# Patient Record
Sex: Male | Born: 1975
Health system: Southern US, Community
[De-identification: ages and names within clinical notes are randomized; demographics above are authoritative.]

---

## 2016-09-27 ENCOUNTER — Emergency Department (HOSPITAL_COMMUNITY)
Admission: EM | Admit: 2016-09-27 | Discharge: 2016-09-27 | Disposition: A | Discharge status: Home / Self Care | Payer: Self-pay | Attending: Emergency Medicine | Admitting: Emergency Medicine

## 2016-09-27 DIAGNOSIS — M25519 Pain in unspecified shoulder: Secondary | ICD-10-CM | POA: Insufficient documentation

## 2016-09-27 DIAGNOSIS — Z5321 Procedure and treatment not carried out due to patient leaving prior to being seen by health care provider: Secondary | ICD-10-CM | POA: Insufficient documentation

## 2016-09-28 ENCOUNTER — Encounter: Payer: Self-pay | Admitting: Physician Assistant

## 2016-09-28 ENCOUNTER — Ambulatory Visit (INDEPENDENT_AMBULATORY_CARE_PROVIDER_SITE_OTHER): Payer: Worker's Compensation | Admitting: Physician Assistant

## 2016-09-28 VITALS — BP 121/73 | HR 58 | Temp 98.2°F | Resp 17 | Ht 72.0 in | Wt 302.0 lb

## 2016-09-28 DIAGNOSIS — M62830 Muscle spasm of back: Secondary | ICD-10-CM | POA: Diagnosis not present

## 2016-09-28 DIAGNOSIS — T1490XA Injury, unspecified, initial encounter: Secondary | ICD-10-CM

## 2016-09-28 DIAGNOSIS — Z026 Encounter for examination for insurance purposes: Secondary | ICD-10-CM | POA: Diagnosis not present

## 2016-09-28 MED ORDER — CYCLOBENZAPRINE HCL 10 MG PO TABS: 5.0000 mg | ORAL_TABLET | Freq: Three times a day (TID) | ORAL | 0 refills | Status: DC | PRN

## 2016-09-28 MED ORDER — MELOXICAM 15 MG PO TABS: 15.0000 mg | ORAL_TABLET | Freq: Every day | ORAL | 0 refills | Status: DC

## 2016-09-28 NOTE — Progress Notes (Signed)
    09/28/2016 1:19 PM   DOB: July 17, 1975 / MRN: [id number]  SUBJECTIVE:  Derek Gaines is a 41 y.o. male presenting for workers compensation shoulder injury that occurred at work.  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  He has a history of similar pain in the left shoulder. Has been working at his current job for about 3 weeks.   He has no allergies on file.   He  has no past medical history on file.    He  reports that he has never smoked. He has never used smokeless tobacco. He  has no sexual activity history on file. The patient  has no past surgical history on file.  His family history is not on file.  Review of Systems  Musculoskeletal: Positive for back pain, joint pain and myalgias. Negative for falls and neck pain.  Neurological: Negative for dizziness, speech change and focal weakness.    The problem list and medications were reviewed and updated by myself where necessary and exist elsewhere in the encounter.   OBJECTIVE:  BP 121/73   Pulse (!) 58   Temp 98.2 F (36.8 C) (Oral)   Resp 17   Ht 6' (1.829 m)   Wt (!) 302 lb (137 kg)   SpO2 98%   BMI 40.96 kg/m   Physical Exam  Constitutional: He is oriented to person, place, and time. He appears well-developed. He is active and cooperative.  Non-toxic appearance.  Eyes: Pupils are equal, round, and reactive to light. EOM are normal.  Cardiovascular: Normal rate.   Pulmonary/Chest: Effort normal. No tachypnea.  Musculoskeletal: He exhibits tenderness (right middle rhomboid and into the posteior aspect of the serratus anterior.  No winged scapula).       Right shoulder: He exhibits normal range of motion, no tenderness, no bony tenderness, no pain and normal strength.  Neurological: He is alert and oriented to person, place, and time. He has normal strength and normal reflexes. He is not  disoriented. No cranial nerve deficit or sensory deficit. He exhibits normal muscle tone. Coordination and gait normal.  Skin: Skin is warm and dry. He is not diaphoretic. No pallor.  Psychiatric: His behavior is normal.  Vitals reviewed.   No results found for this or any previous visit (from the past 72 hour(s)).  No results found.  ASSESSMENT AND PLAN:  [name] was seen today for shoulder pain.  Diagnoses and all orders for this visit:  Soft tissue injury: Possibly overuse, however his lifting load, per hpi, at work is rather light. Will treat symptomatically and try to get him back to work on Monday.  -     meloxicam (MOBIC) 15 MG tablet; Take 1 tablet (15 mg total) by mouth daily.  Spasm of thoracic back muscle -     cyclobenzaprine (FLEXERIL) 10 MG tablet; Take 0.5-1 tablets (5-10 mg total) by mouth 3 (three) times daily as needed for muscle spasms. Do not mix with narcotics.  Encounter related to worker's compensation claim    The patient is advised to call or return to clinic if he does not see an improvement in symptoms, or to seek the care of the closest emergency department if he worsens with the above plan.   Deliah Boston, MHS, PA-C Primary Care at [facility] 09/28/2016 1:19 PM

## 2016-09-28 NOTE — Patient Instructions (Addendum)
  I think you will be well enough to go back to work on Monday with some rest and medication.    IF you received an x-ray today, you will receive an invoice from [facility]. Please contact [facility] at [phone] with questions or concerns regarding your invoice.   IF you received labwork today, you will receive an invoice from [facility]. Please contact [facility] at [phone] with questions or concerns regarding your invoice.   Our billing staff will not be able to assist you with questions regarding bills from these companies.  You will be contacted with the lab results as soon as they are available. The fastest way to get your results is to activate your My Chart account. Instructions are located on the last page of this paperwork. If you have not heard from [place] regarding the results in 2 weeks, please contact this office.

## 2016-09-28 NOTE — Addendum Note (Signed)
Addended by: Ofilia Neas on: 09/28/2016 01:21 PM   Modules accepted: Level of Service

## 2017-01-16 ENCOUNTER — Ambulatory Visit (INDEPENDENT_AMBULATORY_CARE_PROVIDER_SITE_OTHER): Payer: BLUE CROSS/BLUE SHIELD | Admitting: Physician Assistant

## 2017-01-16 ENCOUNTER — Other Ambulatory Visit: Payer: Self-pay

## 2017-01-16 ENCOUNTER — Encounter: Payer: Self-pay | Admitting: Physician Assistant

## 2017-01-16 VITALS — BP 112/70 | HR 72 | Temp 98.3°F | Resp 16 | Ht 70.91 in | Wt 312.6 lb

## 2017-01-16 DIAGNOSIS — Z130 Encounter for screening for diseases of the blood and blood-forming organs and certain disorders involving the immune mechanism: Secondary | ICD-10-CM

## 2017-01-16 DIAGNOSIS — R945 Abnormal results of liver function studies: Secondary | ICD-10-CM | POA: Diagnosis not present

## 2017-01-16 DIAGNOSIS — Z13228 Encounter for screening for other metabolic disorders: Secondary | ICD-10-CM | POA: Diagnosis not present

## 2017-01-16 DIAGNOSIS — Z1321 Encounter for screening for nutritional disorder: Secondary | ICD-10-CM

## 2017-01-16 DIAGNOSIS — Z0000 Encounter for general adult medical examination without abnormal findings: Secondary | ICD-10-CM | POA: Diagnosis not present

## 2017-01-16 DIAGNOSIS — R7989 Other specified abnormal findings of blood chemistry: Secondary | ICD-10-CM

## 2017-01-16 DIAGNOSIS — Z1329 Encounter for screening for other suspected endocrine disorder: Secondary | ICD-10-CM | POA: Diagnosis not present

## 2017-01-16 NOTE — Patient Instructions (Signed)
     IF you received an x-ray today, you will receive an invoice from [facility]. Please contact [facility] at [phone] with questions or concerns regarding your invoice.   IF you received labwork today, you will receive an invoice from [facility]. Please contact [facility] at [phone] with questions or concerns regarding your invoice.   Our billing staff will not be able to assist you with questions regarding bills from these companies.  You will be contacted with the lab results as soon as they are available. The fastest way to get your results is to activate your My Chart account. Instructions are located on the last page of this paperwork. If you have not heard from us regarding the results in 2 weeks, please contact this office.     

## 2017-01-16 NOTE — Progress Notes (Signed)
01/19/2017 8:13 AM   DOB: 1975/12/12 / MRN: [id number]  SUBJECTIVE:  Derek Gaines is a 42 y.o. male presenting for annual exam. From [place].  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  Lost his wife and 92 month old son suddenly about two years ago.  Is still dealing with this but tries to keep a positive outlook.  No known family history of colon or prostate cancer.   Depression screen PHQ 2/9 01/16/2017  Decreased Interest 1  Down, Depressed, Hopeless 1  PHQ - 2 Score 2  Altered sleeping 1  Tired, decreased energy 1  Change in appetite 1  Feeling bad or failure about yourself  1  Trouble concentrating 0  Moving slowly or fidgety/restless 0  Suicidal thoughts 0  PHQ-9 Score 6  Difficult doing work/chores Somewhat difficult     He has No Known Allergies.   He  has no past medical history on file.    He  reports that  has never smoked. he has never used smokeless tobacco. He  has no sexual activity history on file. The patient  has no past surgical history on file.  His family history is not on file.  Review of Systems  Constitutional: Negative for chills, diaphoresis and fever.  Eyes: Negative.   Respiratory: Negative for cough, hemoptysis, sputum production, shortness of breath and wheezing.   Cardiovascular: Negative for chest pain, orthopnea and leg swelling.  Gastrointestinal: Negative for abdominal pain, blood in stool, constipation, diarrhea, heartburn, melena, nausea and vomiting.  Genitourinary: Negative for flank pain.  Skin: Negative for rash.  Neurological: Negative for dizziness, sensory change, speech change, focal weakness and headaches.    The problem list and medications were reviewed and updated by myself where necessary and exist elsewhere in the encounter.   OBJECTIVE:  BP 112/70 (BP Location: Right Arm, Patient Position: Sitting, Cuff Size: Large)   Pulse 72   Temp 98.3 F (36.8 C) (Oral)   Resp 16   Ht 5' 10.91" (1.801 m)   Wt (!) 312 lb 9.6  oz (141.8 kg)   SpO2 97%   BMI 43.71 kg/m   Physical Exam  Constitutional: He appears well-developed. He is active and cooperative.  Non-toxic appearance.  Cardiovascular: Normal rate, regular rhythm, S1 normal, S2 normal, normal heart sounds, intact distal pulses and normal pulses. Exam reveals no gallop and no friction rub.  No murmur heard. Pulmonary/Chest: Effort normal. No stridor. No tachypnea. No respiratory distress. He has no wheezes. He has no rales.  Abdominal: He exhibits no distension.  Musculoskeletal: He exhibits no edema.  Neurological: He is alert.  Skin: Skin is warm and dry. He is not diaphoretic. No pallor.  Vitals reviewed.   Results for orders placed or performed in visit on 01/16/17 (from the past 72 hour(s))  CBC     Status: Abnormal   Collection Time: 01/16/17  4:57 PM  Result Value Ref Range   WBC 6.7 3.4 - 10.8 x10E3/uL   RBC 5.67 4.14 - 5.80 x10E6/uL   Hemoglobin 13.4 13.0 - 17.7 g/dL   Hematocrit 95.6 21.3 - 51.0 %   MCV 73 (L) 79 - 97 fL   MCH 23.6 (L) 26.6 - 33.0 pg   MCHC 32.4 31.5 - 35.7 g/dL   RDW 08.6 57.8 - 46.9 %   Platelets 328 150 - 379 x10E3/uL  Lipid panel     Status: Abnormal   Collection Time: 01/16/17  4:57 PM  Result Value Ref  Range   Cholesterol, Total 167 100 - 199 mg/dL   Triglycerides 85 0 - 149 mg/dL   HDL 37 (L) >16 mg/dL   VLDL Cholesterol Cal 17 5 - 40 mg/dL   LDL Calculated 109 (H) 0 - 99 mg/dL   Chol/HDL Ratio 4.5 0.0 - 5.0 ratio    Comment:                                   T. Chol/HDL Ratio                                             Men  Women                               1/2 Avg.Risk  3.4    3.3                                   Avg.Risk  5.0    4.4                                2X Avg.Risk  9.6    7.1                                3X Avg.Risk 23.4   11.0   TSH     Status: None   Collection Time: 01/16/17  4:57 PM  Result Value Ref Range   TSH 0.638 0.450 - 4.500 uIU/mL  Hemoglobin A1c     Status: Abnormal    Collection Time: 01/16/17  4:57 PM  Result Value Ref Range   Hgb A1c MFr Bld 6.1 (H) 4.8 - 5.6 %    Comment:          Prediabetes: 5.7 - 6.4          Diabetes: >6.4          Glycemic control for adults with diabetes: <7.0    Est. average glucose Bld gHb Est-mCnc 128 mg/dL  HIV antibody     Status: None   Collection Time: 01/16/17  4:57 PM  Result Value Ref Range   HIV Screen 4th Generation wRfx Non Reactive Non Reactive  Basic metabolic panel     Status: None   Collection Time: 01/16/17  4:57 PM  Result Value Ref Range   Glucose 92 65 - 99 mg/dL   BUN 12 6 - 20 mg/dL   Creatinine, Ser 6.04 0.76 - 1.27 mg/dL   GFR calc non Af Amer 90 >59 mL/min/1.73   GFR calc Af Amer 104 >59 mL/min/1.73   BUN/Creatinine Ratio 12 9 - 20   Sodium 140 134 - 144 mmol/L   Potassium 4.5 3.5 - 5.2 mmol/L   Chloride 101 96 - 106 mmol/L   CO2 23 20 - 29 mmol/L   Calcium 9.6 8.7 - 10.2 mg/dL  Hepatic function panel     Status: Abnormal   Collection Time: 01/16/17  4:57 PM  Result Value Ref Range   Total Protein 7.5 6.0 - 8.5 g/dL   Albumin  4.3 3.5 - 5.5 g/dL   Bilirubin Total 0.3 0.0 - 1.2 mg/dL   Bilirubin, Direct 4.090.11 0.00 - 0.40 mg/dL   Alkaline Phosphatase 85 39 - 117 IU/L   AST 47 (H) 0 - 40 IU/L   ALT 52 (H) 0 - 44 IU/L    No results found.  ASSESSMENT AND PLAN:  Derek Gaines was seen today for annual exam.  Diagnoses and all orders for this visit:  Encounter for annual physical exam: Labs out.  Will see him back next year.  Sooner if any new problems. Has had the seasonal flu given [employer] employee.  Screening for endocrine, nutritional, metabolic and immunity disorder -     CBC -     Lipid panel -     TSH -     Hemoglobin A1c -     HIV antibody -     Basic metabolic panel -     Hepatic function panel    The patient is advised to call or return to clinic if he does not see an improvement in symptoms, or to seek the care of the closest emergency department if he worsens with the above  plan.   Deliah BostonMichael Elanor Cale, MHS, PA-C Primary Care at [facility] 01/19/2017 8:13 AM

## 2017-01-17 LAB — CBC
HEMATOCRIT: 41.3 % (ref 37.5–51.0)
HEMOGLOBIN: 13.4 g/dL (ref 13.0–17.7)
MCH: 23.6 pg — ABNORMAL LOW (ref 26.6–33.0)
MCHC: 32.4 g/dL (ref 31.5–35.7)
MCV: 73 fL — ABNORMAL LOW (ref 79–97)
Platelets: 328 10*3/uL (ref 150–379)
RBC: 5.67 x10E6/uL (ref 4.14–5.80)
RDW: 14.9 % (ref 12.3–15.4)
WBC: 6.7 10*3/uL (ref 3.4–10.8)

## 2017-01-17 LAB — BASIC METABOLIC PANEL
BUN/Creatinine Ratio: 12 (ref 9–20)
BUN: 12 mg/dL (ref 6–20)
CALCIUM: 9.6 mg/dL (ref 8.7–10.2)
CO2: 23 mmol/L (ref 20–29)
CREATININE: 1.04 mg/dL (ref 0.76–1.27)
Chloride: 101 mmol/L (ref 96–106)
GFR calc Af Amer: 104 mL/min/{1.73_m2} (ref >59)
GFR, EST NON AFRICAN AMERICAN: 90 mL/min/{1.73_m2} (ref >59)
GLUCOSE: 92 mg/dL (ref 65–99)
Potassium: 4.5 mmol/L (ref 3.5–5.2)
Sodium: 140 mmol/L (ref 134–144)

## 2017-01-17 LAB — HEMOGLOBIN A1C
Est. average glucose Bld gHb Est-mCnc: 128 mg/dL
HEMOGLOBIN A1C: 6.1 % — AB (ref 4.8–5.6)

## 2017-01-17 LAB — HEPATIC FUNCTION PANEL
ALBUMIN: 4.3 g/dL (ref 3.5–5.5)
ALK PHOS: 85 IU/L (ref 39–117)
ALT: 52 IU/L — ABNORMAL HIGH (ref 0–44)
AST: 47 IU/L — AB (ref 0–40)
BILIRUBIN TOTAL: 0.3 mg/dL (ref 0.0–1.2)
BILIRUBIN, DIRECT: 0.11 mg/dL (ref 0.00–0.40)
TOTAL PROTEIN: 7.5 g/dL (ref 6.0–8.5)

## 2017-01-17 LAB — LIPID PANEL
CHOL/HDL RATIO: 4.5 ratio (ref 0.0–5.0)
CHOLESTEROL TOTAL: 167 mg/dL (ref 100–199)
HDL: 37 mg/dL — AB (ref >39)
LDL CALC: 113 mg/dL — AB (ref 0–99)
TRIGLYCERIDES: 85 mg/dL (ref 0–149)
VLDL CHOLESTEROL CAL: 17 mg/dL (ref 5–40)

## 2017-01-17 LAB — HIV ANTIBODY (ROUTINE TESTING W REFLEX): HIV Screen 4th Generation wRfx: NONREACTIVE

## 2017-01-17 LAB — TSH: TSH: 0.638 u[IU]/mL (ref 0.450–4.500)

## 2017-01-17 NOTE — Progress Notes (Signed)
Please add on acute hepatitis panel and iron. Dx elevated lft

## 2017-01-19 DIAGNOSIS — R945 Abnormal results of liver function studies: Secondary | ICD-10-CM | POA: Diagnosis not present

## 2017-01-19 NOTE — Addendum Note (Signed)
Addended by: Baldwin CrownJOHNSON, Saumya Hukill D on: 01/19/2017 10:21 AM   Modules accepted: Orders

## 2017-01-23 LAB — IRON: Iron: 55 ug/dL (ref 38–169)

## 2017-01-23 LAB — HEPATITIS PANEL, ACUTE
HEP A IGM: NEGATIVE
HEP B C IGM: NEGATIVE
HEP B S AG: NEGATIVE

## 2017-04-17 DIAGNOSIS — M84375A Stress fracture, left foot, initial encounter for fracture: Secondary | ICD-10-CM | POA: Diagnosis not present

## 2017-04-17 DIAGNOSIS — M7672 Peroneal tendinitis, left leg: Secondary | ICD-10-CM | POA: Diagnosis not present

## 2017-04-24 DIAGNOSIS — M84375D Stress fracture, left foot, subsequent encounter for fracture with routine healing: Secondary | ICD-10-CM | POA: Diagnosis not present

## 2017-05-08 DIAGNOSIS — M84375D Stress fracture, left foot, subsequent encounter for fracture with routine healing: Secondary | ICD-10-CM | POA: Diagnosis not present

## 2017-05-21 DIAGNOSIS — M84375D Stress fracture, left foot, subsequent encounter for fracture with routine healing: Secondary | ICD-10-CM | POA: Diagnosis not present

## 2017-08-02 DIAGNOSIS — M84375A Stress fracture, left foot, initial encounter for fracture: Secondary | ICD-10-CM | POA: Diagnosis not present

## 2017-11-14 ENCOUNTER — Ambulatory Visit: Payer: BLUE CROSS/BLUE SHIELD | Admitting: Physician Assistant

## 2017-11-14 ENCOUNTER — Encounter: Payer: Self-pay | Admitting: Physician Assistant

## 2017-11-14 VITALS — BP 106/69 | HR 81 | Temp 98.1°F | Ht 71.0 in | Wt 305.0 lb

## 2017-11-14 DIAGNOSIS — Z8669 Personal history of other diseases of the nervous system and sense organs: Secondary | ICD-10-CM | POA: Diagnosis not present

## 2017-11-14 DIAGNOSIS — G8929 Other chronic pain: Secondary | ICD-10-CM | POA: Diagnosis not present

## 2017-11-14 DIAGNOSIS — M546 Pain in thoracic spine: Secondary | ICD-10-CM

## 2017-11-14 DIAGNOSIS — Z87312 Personal history of (healed) stress fracture: Secondary | ICD-10-CM

## 2017-11-14 MED ORDER — MELOXICAM 7.5 MG PO TABS: 7.5000 mg | ORAL_TABLET | Freq: Every day | ORAL | 1 refills | Status: DC

## 2017-11-14 MED ORDER — CYCLOBENZAPRINE HCL 10 MG PO TABS: 5.0000 mg | ORAL_TABLET | Freq: Three times a day (TID) | ORAL | 0 refills | Status: DC | PRN

## 2017-11-14 NOTE — Patient Instructions (Addendum)
Come back in Jan for your annual exam and blood work.   Meloxiam is an NSAID. Do not use with any other otc pain medication other than tylenol/acetaminophen - so no aleve, ibuprofen, motrin, advil, etc.  Flexeril is a muscle relaxer. Try taking 61m instead of 139mand see if this makes you less drowsy  You can take these medications together.   Apply moist heat to the area. Wet a towel and wring it out so it is damp. Put it in the microwave for about 15-20 seconds - long enough to make it hot, but not too hot to apply to your skin causing burns. Do this for about 20-30 minutes, 3-4 times a day.   Perform gentle, light stretches 2-3 times a day.   Put a tennis ball between your back and a wall. Gentle massage the area with rolling the ball around the affected area. Try a foam roller or .   Stay well hydrated - try to drink 32-64 oz/day.   You will receive a phone call to schedule an appointment with sports medicine for your foot, physical therapy and sleep studies.    Thank you for coming in today. I hope you feel we met your needs.  Feel free to call PCP if you have any questions or further requests.  Please consider signing up for MyChart if you do not already have it, as this is a great way to communicate with me.  Best,  WhITT IndustriesPA-C

## 2017-11-14 NOTE — Progress Notes (Signed)
   Derek Gaines  MRN: [id number] DOB: 12/10/1975  PCP: Patient, No Pcp Per  Subjective:  Pt is a 42 year old male who presents to clinic for several complaints.   Back pain - mid back. Has been a problem for him for > 10 years. 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 Problem comes and goes. Happens if he overdoes it at work. Feels "tight"when pain is present he cannot sit up straight.  Tylenol or warm compress. He stretches, massage roller and gets massages sometimes.  Has taken flexeril 10mg  is the past - helps the tightness, but it makes him very drowsy.  No pain currently.   H/o stress fracture of left foot in Feb 2019. con't to experience pain especially with steps. Pain is sometimes "shooting." pain is every day. Is intermittent.   OSA on CPAP x 5 years. Is not using CPAP. Would like another study.   Social history: Wife passed about 5 years ago from complications due to eclampsia while 8 months pregnant. She lost the child as well.  He has a 55 year old son.   Review of Systems  Musculoskeletal: Positive for arthralgias, back pain and gait problem.  Neurological: Negative for dizziness, light-headedness and headaches.    There are no active problems to display for this patient.   No current outpatient medications on file prior to visit.   No current facility-administered medications on file prior to visit.    No Known Allergies   Objective:  Blood pressure 106/69, pulse 81, temperature 98.1 F (36.7 C), temperature source Oral, height 5\' 11"  (1.803 m), weight (!) 305 lb (138.3 kg), SpO2 98 %.  Physical Exam  Constitutional: He appears well-developed and well-nourished. No distress.  Musculoskeletal:       Thoracic back: He exhibits normal range of motion, no tenderness and no bony tenderness.       Lumbar back: He exhibits normal range of motion, no tenderness and no bony tenderness.  Psychiatric: He has a normal mood and affect. Thought  content normal.  Vitals reviewed.   Assessment and Plan :  1. Chronic bilateral thoracic back pain - Intermittent chronic lower and thoracic back pain. Plan to refer to PT for treatment and home care tips. Will try flexeril 5mg  and see if he is less drowsy. Advised pt start exercising on a regular basis.  - Ambulatory referral to Physical Therapy - cyclobenzaprine (FLEXERIL) 10 MG tablet; Take 0.5 tablets (5 mg total) by mouth 3 (three) times daily as needed for muscle spasms.  Dispense: 30 tablet; Refill: 0 - meloxicam (MOBIC) 7.5 MG tablet; Take 1-2 tablets (7.5-15 mg total) by mouth daily.  Dispense: 30 tablet; Refill: 1  2. History of stress fracture - Ambulatory referral to Sports Medicine  3. History of sleep apnea - Ambulatory referral to Sleep Studies  Marco Collie, PA-C  Primary Care at [facility] 11/14/2017 5:02 PM  Please note: Portions of this report may have been transcribed using dragon voice recognition software. Every effort was made to ensure accuracy; however, inadvertent computerized transcription errors may be present.

## 2017-12-11 DIAGNOSIS — M79672 Pain in left foot: Secondary | ICD-10-CM | POA: Diagnosis not present

## 2017-12-11 DIAGNOSIS — M25552 Pain in left hip: Secondary | ICD-10-CM | POA: Diagnosis not present

## 2017-12-11 DIAGNOSIS — M545 Low back pain: Secondary | ICD-10-CM | POA: Diagnosis not present

## 2017-12-11 DIAGNOSIS — M25562 Pain in left knee: Secondary | ICD-10-CM | POA: Diagnosis not present

## 2018-01-26 ENCOUNTER — Encounter: Payer: Self-pay | Admitting: Physician Assistant

## 2018-09-28 ENCOUNTER — Emergency Department (HOSPITAL_COMMUNITY)
Admission: EM | Admit: 2018-09-28 | Discharge: 2018-09-28 | Disposition: A | Discharge status: Home / Self Care | Payer: Self-pay | Attending: Emergency Medicine | Admitting: Emergency Medicine

## 2018-09-28 ENCOUNTER — Encounter (HOSPITAL_COMMUNITY): Payer: Self-pay

## 2018-09-28 ENCOUNTER — Other Ambulatory Visit: Payer: Self-pay

## 2018-09-28 ENCOUNTER — Emergency Department (HOSPITAL_BASED_OUTPATIENT_CLINIC_OR_DEPARTMENT_OTHER): Payer: Self-pay

## 2018-09-28 ENCOUNTER — Emergency Department (HOSPITAL_COMMUNITY): Payer: Self-pay

## 2018-09-28 DIAGNOSIS — R2242 Localized swelling, mass and lump, left lower limb: Secondary | ICD-10-CM | POA: Insufficient documentation

## 2018-09-28 DIAGNOSIS — M25562 Pain in left knee: Secondary | ICD-10-CM

## 2018-09-28 DIAGNOSIS — G8929 Other chronic pain: Secondary | ICD-10-CM

## 2018-09-28 MED ORDER — CYCLOBENZAPRINE HCL 5 MG PO TABS: 5.0000 mg | ORAL_TABLET | Freq: Three times a day (TID) | ORAL | 0 refills | Status: DC | PRN

## 2018-09-28 MED ORDER — IBUPROFEN 800 MG PO TABS: 800.0000 mg | ORAL_TABLET | Freq: Three times a day (TID) | ORAL | 0 refills | Status: DC | PRN

## 2018-09-28 MED ORDER — IBUPROFEN 800 MG PO TABS
800.0000 mg | ORAL_TABLET | Freq: Once | ORAL | Status: AC
  Administered 2018-09-28: 15:00:00 800 mg via ORAL
  Filled 2018-09-28: qty 1

## 2018-09-28 NOTE — ED Provider Notes (Signed)
[facility] DEPT Provider Note   CSN: [id number] Arrival date & time: 09/28/18  1343     History   Chief Complaint Chief Complaint  Patient presents with  . Knee Pain    HPI Derek Gaines is a 43 y.o. male.     The history is provided by the patient and medical records. No language interpreter was used.  Knee Pain  Derek Gaines is an otherwise healthy 43 y.o. male who presents to the Emergency Department complaining of left knee pain for the last 3 days.  Mostly to the posterior aspect of the knee.  Radiates down into his calf and his calf does feel tight.  Worse with certain movements especially stretching the leg out into full extension as well as with walking.  Denies numbness or weakness.  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  Denies any history of similar.  No recent travel/surgeries/immobilizations.  No history of coagulopathy or previous DVT/PE.  No chest pain or shortness of breath.  No wounds.   History reviewed. No pertinent past medical history.  There are no active problems to display for this patient.   History reviewed. No pertinent surgical history.      Home Medications    Prior to Admission medications   Medication Sig Start Date End Date Taking? Authorizing Provider  cyclobenzaprine (FLEXERIL) 10 MG tablet Take 0.5 tablets (5 mg total) by mouth 3 (three) times daily as needed for muscle spasms. 11/14/17   McVey, Gelene Mink, PA-C  meloxicam (MOBIC) 7.5 MG tablet Take 1-2 tablets (7.5-15 mg total) by mouth daily. 11/14/17   McVey, Gelene Mink, PA-C    Family History No family history on file.  Social History Social History   Tobacco Use  . Smoking status: Never Smoker  . Smokeless tobacco: Never Used  Substance Use Topics  . Alcohol use: Yes  . Drug use: Never     Allergies   Patient has no known allergies.   Review  of Systems Review of Systems  Cardiovascular: Positive for leg swelling. Negative for chest pain and palpitations.  Musculoskeletal: Positive for arthralgias and myalgias.  All other systems reviewed and are negative.    Physical Exam Updated Vital Signs BP 131/69 (BP Location: Left Arm)   Pulse 75   Temp 98.6 F (37 C) (Oral)   Resp 18   Ht 5\' 11"  (1.803 m)   Wt 134 kg   SpO2 100%   BMI 41.20 kg/m   Physical Exam Vitals signs and nursing note reviewed.  Constitutional:      General: He is not in acute distress.    Appearance: He is well-developed.  HENT:     Head: Normocephalic and atraumatic.  Neck:     Musculoskeletal: Neck supple.  Cardiovascular:     Rate and Rhythm: Normal rate and regular rhythm.     Heart sounds: Normal heart sounds. No murmur.  Pulmonary:     Effort: Pulmonary effort is normal. No respiratory distress.     Breath sounds: Normal breath sounds. No wheezing or rales.  Musculoskeletal:     Comments: Left lower extremity: Tenderness to the medial joint line as well as posterior fossa of the knee.  Does have mild swelling to the calf and knee when compared to the right lower extremity.  Full range of motion.  Ligaments intact.  No abnormal alignment or patellar  mobility. No bruising, erythema or warmth overlaying the joint. Negative  McMurray's.  No crepitus. 2+ DP pulses bilaterally. All compartments are soft. Sensation intact distal to injury.  Skin:    General: Skin is warm and dry.  Neurological:     Mental Status: He is alert.      ED Treatments / Results  Labs (all labs ordered are listed, but only abnormal results are displayed) Labs Reviewed - No data to display  EKG None  Radiology Dg Knee Complete 4 Views Left  Result Date: 09/28/2018 CLINICAL DATA:  LEFT knee pain. EXAM: LEFT KNEE - COMPLETE 4+ VIEW COMPARISON:  None. FINDINGS: No evidence of fracture, dislocation, or joint effusion. No evidence of arthropathy. No acute or  significant bone abnormality. Soft tissues are unremarkable. IMPRESSION: No acute findings. No degenerative change. No appreciable joint effusion. Electronically Signed   By: Bary RichardStan  Maynard M.D.   On: 09/28/2018 14:28    Procedures Procedures (including critical care time)  Medications Ordered in ED Medications - No data to display   Initial Impression / Assessment and Plan / ED Course  I have reviewed the triage vital signs and the nursing notes.  Pertinent labs & imaging results that were available during my care of the patient were reviewed by me and considered in my medical decision making (see chart for details).        Derek Gaines is a 43 y.o. male who presents to ED for left knee pain with associated calf swelling. NVI on exam. No erythema / warmth to suggestion septic joint. X-ray negative. Given calf tenderness and swelling, DVT study ordered which is pending at shift change. Care assumed by oncoming provider, Dr. Effie ShyWentz, who will follow up on pending DVT study.    Final Clinical Impressions(s) / ED Diagnoses   Final diagnoses:  None    ED Discharge Orders    None       Yuritzy Zehring, Chase PicketJaime Pilcher, PA-C 09/28/18 1509    Alvira MondaySchlossman, Erin, MD 09/28/18 2120

## 2018-09-28 NOTE — ED Provider Notes (Signed)
5:30 PM-nursing reports that the patient has completed Doppler and the results have returned.  I have viewed the results there is no sign of DVT.  Seen by provider earlier and felt to be having musculoskeletal pain.  She completed the paperwork for discharge.  At this point the patient stable for discharge.   Daleen Bo, MD 09/28/18 [phone]

## 2018-09-28 NOTE — Discharge Instructions (Addendum)
It was my pleasure taking care of you today!   Ibuprofen as needed for pain. Ice and elevate knee throughout the day.  Call the orthopedist listed tomorrow to schedule follow up appointment.  Return to the ER for new or worsening symptoms, any additional concerns.

## 2018-09-28 NOTE — Progress Notes (Signed)
LLE venous duplex       has been completed. Preliminary results can be found under CV proc through chart review. Noel Henandez, BS, RDMS, RVT    

## 2018-09-28 NOTE — ED Triage Notes (Signed)
Pt c/o left knee pain x 3 days. 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 When working, he felt his knee get tight and have pain bending it. Pt states when he walks he feels like it is going to buckle under him.

## 2018-09-28 NOTE — ED Notes (Signed)
Patient transported to X-ray 

## 2018-09-30 ENCOUNTER — Ambulatory Visit (INDEPENDENT_AMBULATORY_CARE_PROVIDER_SITE_OTHER): Payer: Self-pay | Admitting: Orthopaedic Surgery

## 2018-09-30 DIAGNOSIS — M25562 Pain in left knee: Secondary | ICD-10-CM

## 2018-09-30 DIAGNOSIS — M25462 Effusion, left knee: Secondary | ICD-10-CM

## 2018-09-30 MED ORDER — LIDOCAINE HCL 1 % IJ SOLN
3.0000 mL | INTRAMUSCULAR | Status: AC | PRN
  Administered 2018-09-30: 3 mL

## 2018-09-30 MED ORDER — METHYLPREDNISOLONE ACETATE 40 MG/ML IJ SUSP
40.0000 mg | INTRAMUSCULAR | Status: AC | PRN
  Administered 2018-09-30: 40 mg via INTRA_ARTICULAR

## 2018-09-30 NOTE — Progress Notes (Signed)
Office Visit Note   Patient: Derek Gaines           Date of Birth: 04/27/1975           MRN: [id number] Visit Date: 09/30/2018              Requested by: No referring provider defined for this encounter. PCP: Patient, No Pcp Per   Assessment & Plan: Visit Diagnoses:  1. Acute pain of left knee   2. Effusion, left knee     Plan: I was able to aspirate about 30 cc of fluid off of the knee.  It was somewhat of a traumatic tap so it is hard to tell whether there was evidence of gout or other crystals.  I then placed a steroid injection in the knee.  I would like to see him back in just 6 days for repeat exam.  I will give him a note he made aware of the remainder the week.  He will continue his 800 mg ibuprofen as well.  All question concerns were answered and addressed.  Again we will see him back this coming Monday to see how he is doing and to see whether or not we need to obtain further studies such as an MRI or just let him get back to work.  Follow-Up Instructions: Return in about 6 days (around 10/06/2018).   Orders:  Orders Placed This Encounter  Procedures   Large Joint Inj   No orders of the defined types were placed in this encounter.     Procedures: Large Joint Inj: L knee on 09/30/2018 11:16 AM Indications: diagnostic evaluation and pain Details: 22 G 1.5 in needle, superolateral approach  Arthrogram: No  Medications: 3 mL lidocaine 1 %; 40 mg methylPREDNISolone acetate 40 MG/ML Outcome: tolerated well, no immediate complications Procedure, treatment alternatives, risks and benefits explained, specific risks discussed. Consent was given by the patient. Immediately prior to procedure a time out was called to verify the correct patient, procedure, equipment, support staff and site/side marked as required. Patient was prepped and draped in the usual sterile fashion.       Clinical Data: No additional findings.   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  He is never injured his left knee before but did present to the emergency room on Sunday with acute left knee swelling.  He denies a history of gout.  He is never had surgery on that knee.  They checked him for DVT which was negative and got x-rays of his knee.  They then put him out of work and start him on Motrin and Flexeril.  He said the swelling is gone down some.  He denies any acute changes in medical status otherwise.  He has not had any significant change in his diet.  He does have a BMI of 41.2.  He has been looking to work out more recently and lose weight. HPI  Review of Systems He currently denies any headache, chest pain, shortness of breath, fever, chills, nausea, vomiting.  He is not had any foot swelling or foot pain or evidence of swelling in other joints.  Objective: Vital Signs: There were no vitals taken for this visit.  Physical Exam He is alert and orient x3 and in no acute distress Ortho Exam Examination of his left knee  and right knee together does show a mild effusion of the left knee.  He does have pain with the left knee in the popliteal area when she flexion past 90 degrees.  The knee is otherwise ligamentously stable. Specialty Comments:  No specialty comments available.  Imaging: No results found. Independent review of x-rays of the left knee show no acute findings with a well-maintained joint space.  PMFS History: There are no active problems to display for this patient.  No past medical history on file.  No family history on file.  No past surgical history on file. Social History   Occupational History   Not on file  Tobacco Use   Smoking status: Never Smoker   Smokeless tobacco: Never Used  Substance and Sexual Activity   Alcohol use: Yes   Drug use: Never   Sexual activity: Yes

## 2018-10-06 ENCOUNTER — Encounter: Payer: Self-pay | Admitting: Orthopaedic Surgery

## 2018-10-06 ENCOUNTER — Ambulatory Visit (INDEPENDENT_AMBULATORY_CARE_PROVIDER_SITE_OTHER): Payer: Self-pay | Admitting: Orthopaedic Surgery

## 2018-10-06 DIAGNOSIS — M25562 Pain in left knee: Secondary | ICD-10-CM

## 2018-10-06 NOTE — Progress Notes (Signed)
HPI: Derek Gaines returns today 6 days status post left knee injection.  He states the injection aspiration performed on 09/30/2018 helped some with his knee pain but he still having giving way sensation in the occasional locking-like sensation in the knee.  Feels the knee is stiff.  Is been out of work.  Also having some pinching around the left hip area which is somewhat vague.  No new injury.  Review of systems: Please see HPI otherwise negative  Physical exam: General well-developed well-nourished male in no acute distress mood and affect appropriate.  Bilateral knees: No abnormal warmth erythema or effusion of either knee.  He has tenderness along the medial joint line of the left knee in the popliteal region of the left knee.  No instability valgus varus stressing of either knee. Left hip: Good range of motion left hip without pain.  No tenderness over the left trochanteric region.  Impression: Acute left knee pain  Plan: Due to patient's continued mechanical symptoms of the knee recommend MRI to rule out meniscal tear.  Have him follow-up after the MRI to go over results discuss further treatment.  Questions encouraged and answered at length.  We will continue to keep him out of work until follow-up after the MRI.

## 2018-11-02 ENCOUNTER — Other Ambulatory Visit: Payer: Self-pay

## 2018-11-06 ENCOUNTER — Ambulatory Visit: Payer: Self-pay | Admitting: Physician Assistant

## 2019-01-27 ENCOUNTER — Ambulatory Visit (INDEPENDENT_AMBULATORY_CARE_PROVIDER_SITE_OTHER): Payer: 59

## 2019-01-27 ENCOUNTER — Other Ambulatory Visit: Payer: Self-pay

## 2019-01-27 ENCOUNTER — Ambulatory Visit (INDEPENDENT_AMBULATORY_CARE_PROVIDER_SITE_OTHER): Payer: 59 | Admitting: Podiatry

## 2019-01-27 ENCOUNTER — Encounter: Payer: Self-pay | Admitting: Podiatry

## 2019-01-27 VITALS — BP 123/73 | HR 65 | Resp 16

## 2019-01-27 DIAGNOSIS — M722 Plantar fascial fibromatosis: Secondary | ICD-10-CM

## 2019-01-27 DIAGNOSIS — M2141 Flat foot [pes planus] (acquired), right foot: Secondary | ICD-10-CM

## 2019-01-27 DIAGNOSIS — M2142 Flat foot [pes planus] (acquired), left foot: Secondary | ICD-10-CM

## 2019-01-27 MED ORDER — METHYLPREDNISOLONE 4 MG PO TBPK: ORAL_TABLET | ORAL | 0 refills | Status: DC

## 2019-01-27 MED ORDER — MELOXICAM 15 MG PO TABS: 15.0000 mg | ORAL_TABLET | Freq: Every day | ORAL | 3 refills | Status: AC

## 2019-01-27 NOTE — Patient Instructions (Signed)

## 2019-01-28 NOTE — Progress Notes (Signed)
  Subjective:  Patient ID: Derek Gaines, male    DOB: 01-03-1976,  MRN: [id number] HPI Chief Complaint  Patient presents with  . Foot Pain    Plantar heel/arch left - aching x 1.5 year, AM pain, Dr. Elijah Birk treated - said had stress fracture, wore boot and Rx'd Ibuprofen (some help), still having pain, at home has been trying insoles, ice, massage, rest, knees are hurting a lot  . New Patient (Initial Visit)    44 y.o. male presents with the above complaint.   ROS: Denies fever chills nausea vomiting muscle aches pains calf pain back pain chest pain shortness of breath.  No past medical history on file. No past surgical history on file.  Current Outpatient Medications:  Marland Kitchen  Multiple Vitamins-Minerals (MULTIVITAMIN GUMMIES ADULT PO), Take by mouth., Disp: , Rfl:  .  meloxicam (MOBIC) 15 MG tablet, Take 1 tablet (15 mg total) by mouth daily., Disp: 30 tablet, Rfl: 3 .  methylPREDNISolone (MEDROL DOSEPAK) 4 MG TBPK tablet, 6 day dose pack - take as directed, Disp: 21 tablet, Rfl: 0  No Known Allergies Review of Systems Objective:   Vitals:   01/27/19 1047  BP: 123/73  Pulse: 65  Resp: 16    General: Well developed, nourished, in no acute distress, alert and oriented x3   Dermatological: Skin is warm, dry and supple bilateral. Nails x 10 are well maintained; remaining integument appears unremarkable at this time. There are no open sores, no preulcerative lesions, no rash or signs of infection present.  Vascular: Dorsalis Pedis artery and Posterior Tibial artery pedal pulses are 2/4 bilateral with immedate capillary fill time. Pedal hair growth present. No varicosities and no lower extremity edema present bilateral.   Neruologic: Grossly intact via light touch bilateral. Vibratory intact via tuning fork bilateral. Protective threshold with Semmes Wienstein monofilament intact to all pedal sites bilateral. Patellar and Achilles deep tendon reflexes 2+ bilateral. No Babinski or clonus  noted bilateral.   Musculoskeletal: No gross boney pedal deformities bilateral. No pain, crepitus, or limitation noted with foot and ankle range of motion bilateral. Muscular strength 5/5 in all groups tested bilateral.  Flexible pes planus is noted bilateral with pain on palpation medial calcaneal tubercles bilaterally.  Gait: Unassisted, Nonantalgic.    Radiographs:  Radiographs taken today demonstrate osseously mature individual no acute findings.  Soft tissue increase in density plantar fashion calcaneal insertion site.  Assessment & Plan:   Assessment: Plantar fasciitis bilateral  Plan: Discussed etiology pathology conservative versus surgical therapies.  Discussed the possible need for orthotics.  I went ahead and injected his bilateral heels today with 20 mg of Kenalog each.  Start him on methylprednisolone followed by meloxicam.  Plantar fascial brace is dispensed bilateral and a single night splint for the worst foot.  Discussed appropriate shoe gear stretching exercises ice therapy and shoe gear modifications.     Ermal Brzozowski T. [place], [place]

## 2019-02-24 ENCOUNTER — Other Ambulatory Visit: Payer: Self-pay

## 2019-02-24 ENCOUNTER — Ambulatory Visit (INDEPENDENT_AMBULATORY_CARE_PROVIDER_SITE_OTHER): Payer: 59 | Admitting: Podiatry

## 2019-02-24 ENCOUNTER — Encounter: Payer: Self-pay | Admitting: Podiatry

## 2019-02-24 DIAGNOSIS — M722 Plantar fascial fibromatosis: Secondary | ICD-10-CM

## 2019-02-24 NOTE — Progress Notes (Signed)
He presents today for follow-up of his plantar fasciitis states that his heels are about 60% improved continues to take the meloxicam and wear his plantar fascial brace he also purchased a plantar fascial sleeve from [employer].  He says with the combination he can stand up for considerable amount of time.  Objective: Vital signs are stable he is alert oriented x3 no pain on palpation medial calcaneal tubercles bilaterally.  Pulses are palpable bilateral.  Flexible pes planus is noted bilateral.  Assessment: Plantar fasciitis bilateral.  Pes planus bilateral.  Plan: Discussed etiology pathology conservative surgical therapies at this point time I recommend that he follow-up with Ra[facility] Noble or [facility] Foy for orthotic fabrication.  He will continue the use of his braces and splints as well as his medication.  Follow-up with me once the orthotics come in.

## 2019-03-09 ENCOUNTER — Other Ambulatory Visit: Payer: 59 | Admitting: Orthotics

## 2019-03-09 ENCOUNTER — Other Ambulatory Visit: Payer: Self-pay

## 2019-03-09 DIAGNOSIS — M2141 Flat foot [pes planus] (acquired), right foot: Secondary | ICD-10-CM

## 2019-03-09 DIAGNOSIS — M2142 Flat foot [pes planus] (acquired), left foot: Secondary | ICD-10-CM

## 2019-03-17 DIAGNOSIS — L910 Hypertrophic scar: Secondary | ICD-10-CM | POA: Diagnosis not present

## 2019-03-31 ENCOUNTER — Other Ambulatory Visit: Payer: Self-pay

## 2019-03-31 ENCOUNTER — Encounter: Payer: 59 | Admitting: Orthotics

## 2019-04-09 DIAGNOSIS — M2142 Flat foot [pes planus] (acquired), left foot: Secondary | ICD-10-CM | POA: Diagnosis not present

## 2019-04-09 DIAGNOSIS — M1712 Unilateral primary osteoarthritis, left knee: Secondary | ICD-10-CM | POA: Diagnosis not present

## 2019-04-09 DIAGNOSIS — M25561 Pain in right knee: Secondary | ICD-10-CM | POA: Diagnosis not present

## 2019-04-09 DIAGNOSIS — M25562 Pain in left knee: Secondary | ICD-10-CM | POA: Diagnosis not present

## 2019-04-22 DIAGNOSIS — M25562 Pain in left knee: Secondary | ICD-10-CM | POA: Diagnosis not present

## 2019-04-23 DIAGNOSIS — R6889 Other general symptoms and signs: Secondary | ICD-10-CM | POA: Diagnosis not present

## 2019-04-23 DIAGNOSIS — Z0000 Encounter for general adult medical examination without abnormal findings: Secondary | ICD-10-CM | POA: Diagnosis not present

## 2019-04-23 DIAGNOSIS — Z833 Family history of diabetes mellitus: Secondary | ICD-10-CM | POA: Diagnosis not present

## 2019-04-23 DIAGNOSIS — E782 Mixed hyperlipidemia: Secondary | ICD-10-CM | POA: Diagnosis not present

## 2019-04-23 DIAGNOSIS — Z13220 Encounter for screening for lipoid disorders: Secondary | ICD-10-CM | POA: Diagnosis not present

## 2019-04-23 DIAGNOSIS — E1169 Type 2 diabetes mellitus with other specified complication: Secondary | ICD-10-CM | POA: Diagnosis not present

## 2019-04-23 DIAGNOSIS — R739 Hyperglycemia, unspecified: Secondary | ICD-10-CM | POA: Diagnosis not present

## 2019-05-02 ENCOUNTER — Ambulatory Visit: Payer: Self-pay

## 2019-05-02 ENCOUNTER — Ambulatory Visit: Payer: Self-pay | Attending: Internal Medicine

## 2019-05-02 DIAGNOSIS — Z23 Encounter for immunization: Secondary | ICD-10-CM

## 2019-05-02 NOTE — Progress Notes (Signed)
   Covid-19 Vaccination Clinic  Name:  Derek Gaines    MRN: [id number] DOB: [birth year]  05/02/2019  [name] was observed post Covid-19 immunization for 15 minutes without incident. He was provided with Vaccine Information Sheet and instruction to access the V-Safe system.   [name] was instructed to call 911 with any severe reactions post vaccine: Marland Kitchen Difficulty breathing  . Swelling of face and throat  . A fast heartbeat  . A bad rash all over body  . Dizziness and weakness   Immunizations Administered    Name Date Dose VIS Date Route   Pfizer COVID-19 Vaccine 05/02/2019 12:50 PM 0.3 mL 03/04/2018 Intramuscular   Manufacturer: [employer], [employer]   Lot: [id number]   NDC: [address]-4

## 2019-05-25 ENCOUNTER — Ambulatory Visit: Payer: Self-pay | Attending: Internal Medicine

## 2019-05-28 ENCOUNTER — Ambulatory Visit: Payer: Self-pay | Attending: Internal Medicine

## 2019-05-28 DIAGNOSIS — Z23 Encounter for immunization: Secondary | ICD-10-CM

## 2019-05-28 NOTE — Progress Notes (Signed)
   Covid-19 Vaccination Clinic  Name:  Derek Gaines    MRN: [id number] DOB: 07-03-1975  05/28/2019  [name] was observed post Covid-19 immunization for 15 minutes without incident. He was provided with Vaccine Information Sheet and instruction to access the V-Safe system.   [name] was instructed to call 911 with any severe reactions post vaccine: Marland Kitchen Difficulty breathing  . Swelling of face and throat  . A fast heartbeat  . A bad rash all over body  . Dizziness and weakness   Immunizations Administered    Name Date Dose VIS Date Route   Pfizer COVID-19 Vaccine 05/28/2019  8:33 AM 0.3 mL 03/04/2018 Intramuscular   Manufacturer: [employer], [employer]   Lot: [id number]   NDC: [address]-5

## 2019-12-23 DIAGNOSIS — R7612 Nonspecific reaction to cell mediated immunity measurement of gamma interferon antigen response without active tuberculosis: Secondary | ICD-10-CM | POA: Diagnosis not present

## 2020-01-16 ENCOUNTER — Telehealth: Payer: Self-pay | Admitting: Nurse Practitioner

## 2020-01-16 DIAGNOSIS — R0602 Shortness of breath: Secondary | ICD-10-CM

## 2020-01-16 DIAGNOSIS — U071 COVID-19: Secondary | ICD-10-CM

## 2020-01-16 DIAGNOSIS — R059 Cough, unspecified: Secondary | ICD-10-CM

## 2020-01-16 MED ORDER — BENZONATATE 100 MG PO CAPS: 100.0000 mg | ORAL_CAPSULE | Freq: Three times a day (TID) | ORAL | 0 refills | Status: AC | PRN

## 2020-01-16 MED ORDER — ALBUTEROL SULFATE HFA 108 (90 BASE) MCG/ACT IN AERS: 2.0000 | INHALATION_SPRAY | Freq: Four times a day (QID) | RESPIRATORY_TRACT | 0 refills | Status: AC | PRN

## 2020-01-16 NOTE — Progress Notes (Signed)
E-Visit for Corona Virus Screening  We are sorry you are not feeling well. We are here to help!  You have tested positive for COVID-19, meaning that you were infected with the novel coronavirus and could give the virus to others.  It is vitally important that you stay home so you do not spread it to others.      Please continue isolation at home, for at least 10 days since the start of your symptoms and until you have had 24 hours with no fever (without taking a fever reducer) and with improving of symptoms.  If you have no symptoms but tested positive (or all symptoms resolve after 5 days and you have no fever) you can leave your house but continue to wear a mask around others for an additional 5 days. If you have a fever,continue to stay home until you have had 24 hours of no fever. Most cases improve 5-10 days from onset but we have seen a small number of patients who have gotten worse after the 10 days.  Please be sure to watch for worsening symptoms and remain taking the proper precautions.   Go to the nearest hospital ED for assessment if fever/cough/breathlessness are severe or illness seems like a threat to life.    The following symptoms may appear 2-14 days after exposure: . Fever . Cough . Shortness of breath or difficulty breathing . Chills . Repeated shaking with chills . Muscle pain . Headache . Sore throat . New loss of taste or smell . Fatigue . Congestion or runny nose . Nausea or vomiting . Diarrhea  You have been enrolled in [facility] Monitoring for COVID-19. Daily you will receive a questionnaire within the MyChart website. Our COVID-19 response team will be monitoring your responses daily.  You can use medication such as A prescription cough medication called Tessalon Perles 100 mg. You may take 1-2 capsules every 8 hours as needed for cough and A prescription inhaler called Albuterol MDI 90 mcg /actuation 2 puffs every 4 hours as needed for shortness of breath,  wheezing, cough  You do not meet criteria for monoclonial antibodies. So if your SOB worsens or you pulse ox does or stay above 90 you need to go to ER.   You may also take acetaminophen (Tylenol) as needed for fever.  HOME CARE: . Only take medications as instructed by your medical team. . Drink plenty of fluids and get plenty of rest. . A steam or ultrasonic humidifier can help if you have congestion.   GET HELP RIGHT AWAY IF YOU HAVE EMERGENCY WARNING SIGNS.  Call 911 or proceed to your closest emergency facility if: . You develop worsening high fever. . Trouble breathing . Bluish lips or face . Persistent pain or pressure in the chest . New confusion . Inability to wake or stay awake . You cough up blood. . Your symptoms become more severe . Inability to hold down food or fluids  This list is not all possible symptoms. Contact your medical provider for any symptoms that are severe or concerning to you.    Your e-visit answers were reviewed by a board certified advanced clinical practitioner to complete your personal care plan.  Depending on the condition, your plan could have included both over the counter or prescription medications.  If there is a problem please reply once you have received a response from your provider.  Your safety is important to [place].  If you have drug allergies check your  prescription carefully.    You can use MyChart to ask questions about today's visit, request a non-urgent call back, or ask for a work or school excuse for 24 hours related to this e-Visit. If it has been greater than 24 hours you will need to follow up with your provider, or enter a new e-Visit to address those concerns. You will get an e-mail in the next two days asking about your experience.  I hope that your e-visit has been valuable and will speed your recovery. Thank you for using e-visits.   5-10 minutes spent reviewing and documenting in chart.

## 2020-02-01 ENCOUNTER — Ambulatory Visit: Payer: Self-pay | Attending: Internal Medicine

## 2020-02-01 ENCOUNTER — Other Ambulatory Visit (HOSPITAL_BASED_OUTPATIENT_CLINIC_OR_DEPARTMENT_OTHER): Payer: Self-pay | Admitting: Internal Medicine

## 2020-02-01 DIAGNOSIS — Z23 Encounter for immunization: Secondary | ICD-10-CM

## 2020-02-01 MED FILL — PFIZER-BIONTECH COVID-19 VA: 30 | 21 days supply | Qty: 0 | Fill #0

## 2020-02-01 NOTE — Progress Notes (Signed)
   Covid-19 Vaccination Clinic  Name:  Derek Gaines    MRN: [id number] DOB: September 12, 1975  02/01/2020  [name] was observed post Covid-19 immunization for 15 minutes without incident. He was provided with Vaccine Information Sheet and instruction to access the V-Safe system.   [name] was instructed to call 911 with any severe reactions post vaccine: Marland Kitchen Difficulty breathing  . Swelling of face and throat  . A fast heartbeat  . A bad rash all over body  . Dizziness and weakness   Immunizations Administered    Name Date Dose VIS Date Route   Pfizer COVID-19 Vaccine 02/01/2020 10:02 AM 0.3 mL 10/28/2019 Intramuscular   Manufacturer: [employer], [employer]   Lot: [id number]   NDC: [address]-1

## 2020-02-08 ENCOUNTER — Other Ambulatory Visit: Payer: Self-pay | Admitting: Nurse Practitioner

## 2020-03-17 DIAGNOSIS — R7612 Nonspecific reaction to cell mediated immunity measurement of gamma interferon antigen response without active tuberculosis: Secondary | ICD-10-CM | POA: Diagnosis not present

## 2020-08-24 IMAGING — CR DG KNEE COMPLETE 4+V*L*
4 series · 4 of 4 positions shown · non-contrast
Comparison: None.

CLINICAL DATA: LEFT knee pain.

EXAM:
LEFT KNEE - COMPLETE 4+ VIEW

[t knee ap left]
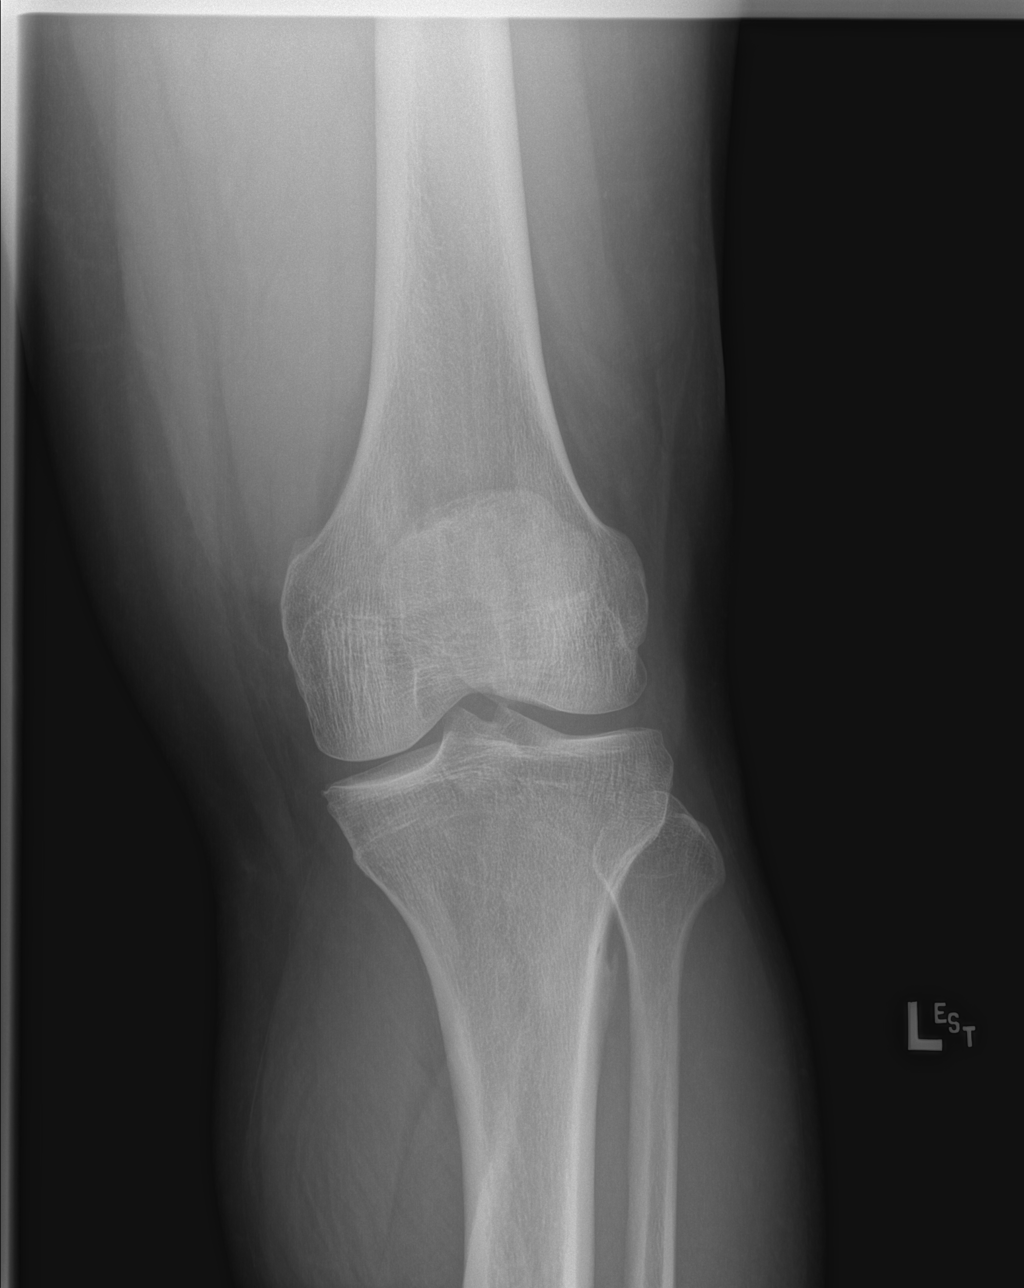

[t knee obl left (1 of 2)]
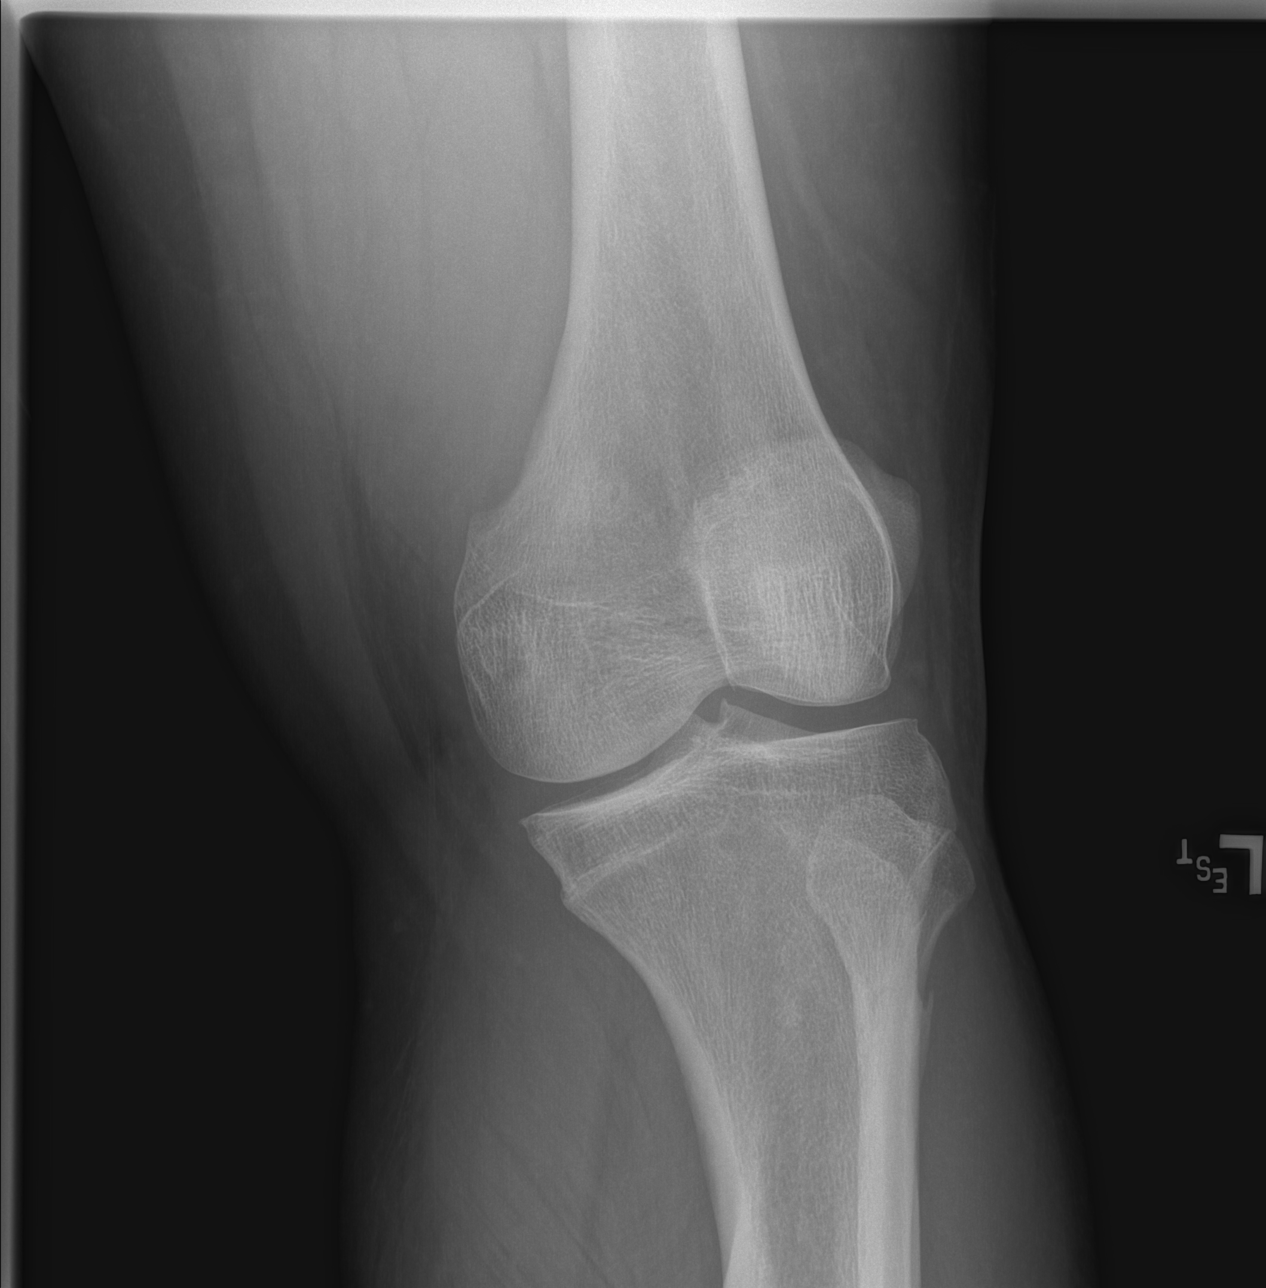

[t knee obl left (2 of 2)]
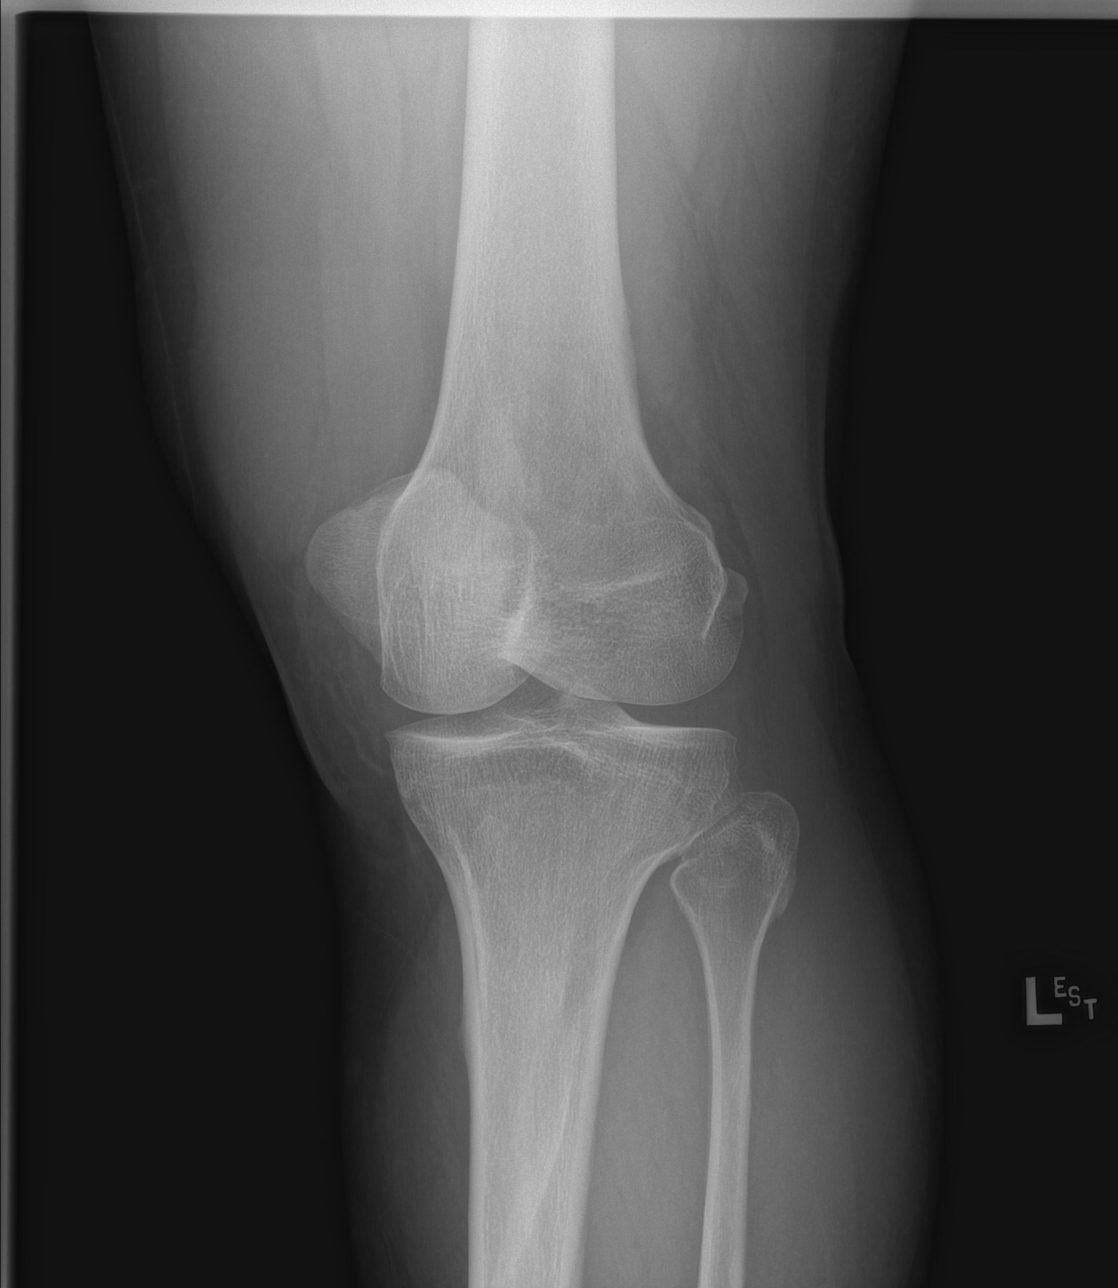

[t knee lat left]
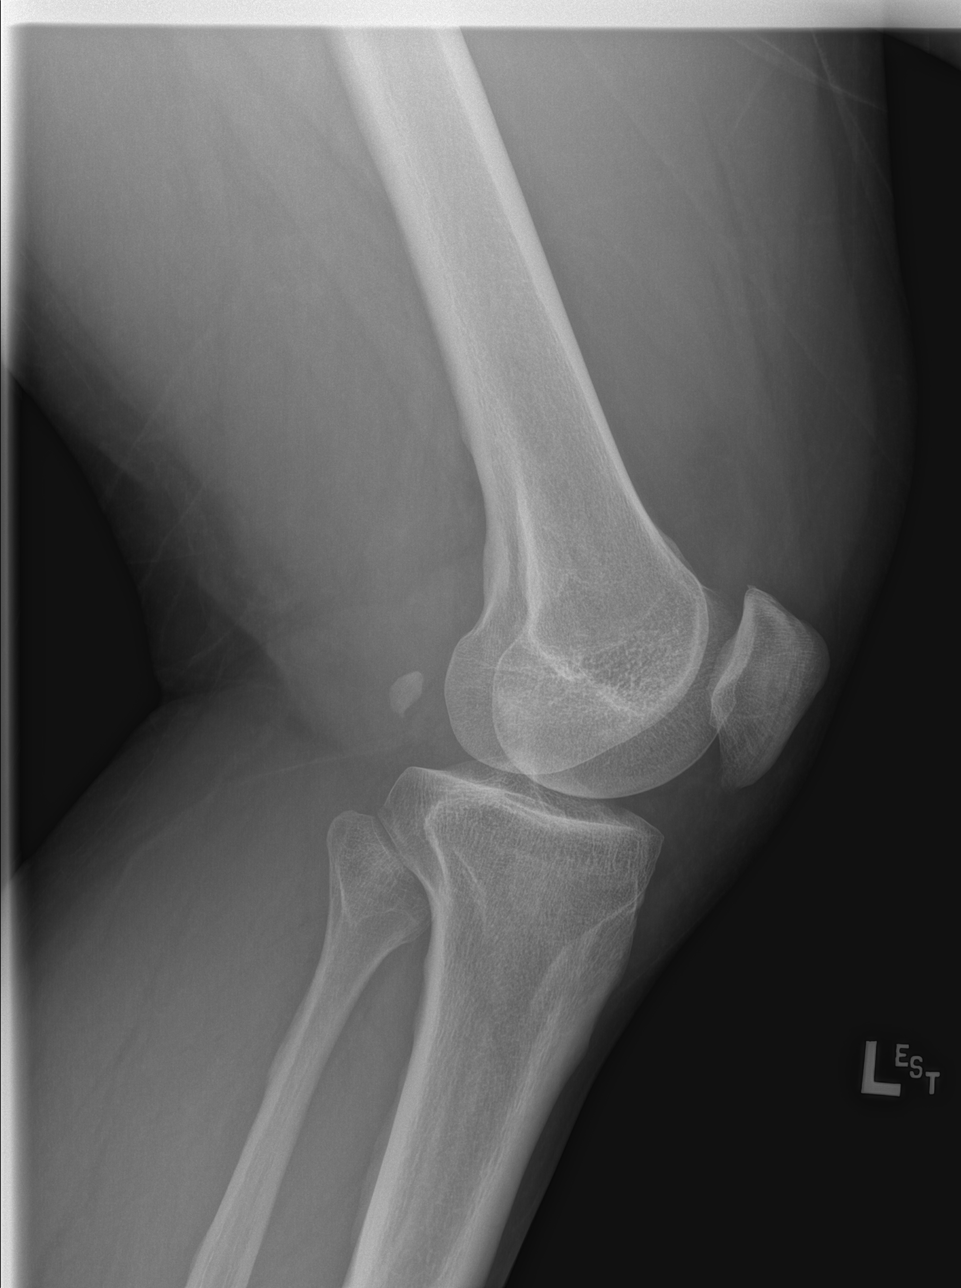

[4 of 4 positions shown; findings below may reference images not displayed]

FINDINGS: No evidence of fracture, dislocation, or joint effusion. No evidence
of arthropathy. No acute or significant bone abnormality. Soft
tissues are unremarkable.
IMPRESSION: No acute findings. No degenerative change. No appreciable joint
effusion.
